# Patient Record
Sex: Male | Born: 1935 | Race: White | Hispanic: No | Marital: Single
Health system: Southern US, Community
[De-identification: ages and names within clinical notes are randomized; demographics above are authoritative.]

---

## 1998-08-27 ENCOUNTER — Observation Stay (HOSPITAL_COMMUNITY): Admission: AD | Admit: 1998-08-27 | Discharge: 1998-08-28 | Payer: Self-pay | Admitting: Cardiology

## 1998-08-29 ENCOUNTER — Encounter: Payer: Self-pay | Admitting: Cardiology

## 1998-08-29 ENCOUNTER — Ambulatory Visit (HOSPITAL_COMMUNITY): Admission: RE | Admit: 1998-08-29 | Discharge: 1998-08-29 | Payer: Self-pay | Admitting: Cardiology

## 1998-09-23 ENCOUNTER — Inpatient Hospital Stay (HOSPITAL_COMMUNITY): Admission: RE | Admit: 1998-09-23 | Discharge: 1998-09-24 | Payer: Self-pay | Admitting: *Deleted

## 1999-04-11 ENCOUNTER — Encounter: Payer: Self-pay | Admitting: *Deleted

## 1999-04-14 ENCOUNTER — Inpatient Hospital Stay (HOSPITAL_COMMUNITY): Admission: RE | Admit: 1999-04-14 | Discharge: 1999-04-15 | Payer: Self-pay | Admitting: *Deleted

## 2004-11-28 ENCOUNTER — Ambulatory Visit: Payer: Self-pay | Admitting: Internal Medicine

## 2007-06-06 ENCOUNTER — Ambulatory Visit: Payer: Self-pay | Admitting: Anesthesiology

## 2007-06-10 ENCOUNTER — Ambulatory Visit: Payer: Self-pay | Admitting: Anesthesiology

## 2007-06-30 ENCOUNTER — Ambulatory Visit: Payer: Self-pay | Admitting: Anesthesiology

## 2008-01-09 ENCOUNTER — Ambulatory Visit: Payer: Self-pay | Admitting: Anesthesiology

## 2008-01-31 ENCOUNTER — Ambulatory Visit: Payer: Self-pay | Admitting: Anesthesiology

## 2008-03-06 ENCOUNTER — Ambulatory Visit: Payer: Self-pay | Admitting: Anesthesiology

## 2008-03-15 ENCOUNTER — Encounter: Payer: Self-pay | Admitting: Anesthesiology

## 2008-04-01 ENCOUNTER — Encounter: Payer: Self-pay | Admitting: Anesthesiology

## 2008-05-23 ENCOUNTER — Ambulatory Visit: Payer: Self-pay | Admitting: Anesthesiology

## 2008-06-21 ENCOUNTER — Ambulatory Visit: Payer: Self-pay | Admitting: Anesthesiology

## 2008-08-09 ENCOUNTER — Ambulatory Visit: Payer: Self-pay | Admitting: Anesthesiology

## 2008-11-08 ENCOUNTER — Ambulatory Visit: Payer: Self-pay | Admitting: Anesthesiology

## 2009-02-14 ENCOUNTER — Ambulatory Visit: Payer: Self-pay | Admitting: Anesthesiology

## 2009-05-14 ENCOUNTER — Ambulatory Visit: Payer: Self-pay | Admitting: Anesthesiology

## 2009-07-16 ENCOUNTER — Ambulatory Visit: Payer: Self-pay | Admitting: Vascular Surgery

## 2009-08-06 ENCOUNTER — Ambulatory Visit: Payer: Self-pay | Admitting: Vascular Surgery

## 2012-08-30 ENCOUNTER — Ambulatory Visit: Payer: Self-pay | Admitting: Oncology

## 2012-09-18 LAB — URINALYSIS, COMPLETE
Bilirubin,UR: NEGATIVE
Ketone: NEGATIVE
Nitrite: NEGATIVE
RBC,UR: 19 /HPF (ref 0–5)
Specific Gravity: 1.016 (ref 1.003–1.030)
WBC UR: 638 /HPF (ref 0–5)

## 2012-09-19 ENCOUNTER — Inpatient Hospital Stay: Payer: Self-pay | Admitting: Internal Medicine

## 2012-09-19 LAB — CBC
HCT: 34.6 % — ABNORMAL LOW (ref 40.0–52.0)
HGB: 11 g/dL — ABNORMAL LOW (ref 13.0–18.0)
MCH: 28.7 pg (ref 26.0–34.0)
MCV: 90 fL (ref 80–100)
Platelet: 230 10*3/uL (ref 150–440)
RBC: 3.83 10*6/uL — ABNORMAL LOW (ref 4.40–5.90)
RDW: 14.3 % (ref 11.5–14.5)

## 2012-09-19 LAB — BASIC METABOLIC PANEL
Anion Gap: 6 — ABNORMAL LOW (ref 7–16)
BUN: 15 mg/dL (ref 7–18)
Co2: 28 mmol/L (ref 21–32)
Creatinine: 0.95 mg/dL (ref 0.60–1.30)
EGFR (African American): >60
EGFR (Non-African Amer.): >60
Glucose: 147 mg/dL — ABNORMAL HIGH (ref 65–99)
Osmolality: 279 (ref 275–301)
Potassium: 3.9 mmol/L (ref 3.5–5.1)

## 2012-09-19 LAB — MAGNESIUM
Magnesium: 1.7 mg/dL — ABNORMAL LOW
Magnesium: 1.9 mg/dL

## 2012-09-19 LAB — COMPREHENSIVE METABOLIC PANEL
Albumin: 2.9 g/dL — ABNORMAL LOW (ref 3.4–5.0)
Bilirubin,Total: 0.3 mg/dL (ref 0.2–1.0)
Calcium, Total: 7.9 mg/dL — ABNORMAL LOW (ref 8.5–10.1)
Chloride: 101 mmol/L (ref 98–107)
Co2: 29 mmol/L (ref 21–32)
Creatinine: 0.96 mg/dL (ref 0.60–1.30)
EGFR (African American): >60
Glucose: 120 mg/dL — ABNORMAL HIGH (ref 65–99)
Potassium: 3.8 mmol/L (ref 3.5–5.1)
SGPT (ALT): 17 U/L (ref 12–78)

## 2012-09-19 LAB — CK TOTAL AND CKMB (NOT AT ARMC): CK, Total: 51 U/L (ref 35–232)

## 2012-09-19 LAB — TROPONIN I: Troponin-I: <0.02 ng/mL

## 2012-09-19 LAB — HEMOGLOBIN: HGB: 8.2 g/dL — ABNORMAL LOW (ref 13.0–18.0)

## 2012-09-19 LAB — PROTIME-INR: Prothrombin Time: 14.2 secs (ref 11.5–14.7)

## 2012-09-20 LAB — HEMOGLOBIN
HGB: 7.7 g/dL — ABNORMAL LOW (ref 13.0–18.0)
HGB: 7.2 g/dL — ABNORMAL LOW (ref 13.0–18.0)
HGB: 7.2 g/dL — ABNORMAL LOW (ref 13.0–18.0)

## 2012-09-21 LAB — HEMOGLOBIN
HGB: 8 g/dL — ABNORMAL LOW (ref 13.0–18.0)
HGB: 8 g/dL — ABNORMAL LOW (ref 13.0–18.0)

## 2012-09-21 LAB — URINE CULTURE

## 2012-09-23 LAB — PATHOLOGY REPORT

## 2012-09-24 LAB — HEMOGLOBIN
HGB: 8 g/dL — ABNORMAL LOW (ref 13.0–18.0)
HGB: 10.2 g/dL — ABNORMAL LOW (ref 13.0–18.0)

## 2012-09-24 LAB — CEA: CEA: 25.9 ng/mL — ABNORMAL HIGH (ref 0.0–4.7)

## 2012-09-25 LAB — CBC WITH DIFFERENTIAL/PLATELET
Eosinophil #: 0.8 10*3/uL — ABNORMAL HIGH (ref 0.0–0.7)
Eosinophil %: 6.7 %
HCT: 29.3 % — ABNORMAL LOW (ref 40.0–52.0)
HGB: 10 g/dL — ABNORMAL LOW (ref 13.0–18.0)
Lymphocyte #: 2.4 10*3/uL (ref 1.0–3.6)
Lymphocyte %: 18.8 %
MCHC: 34.2 g/dL (ref 32.0–36.0)
Monocyte #: 1.8 x10 3/mm — ABNORMAL HIGH (ref 0.2–1.0)
Monocyte %: 14.3 %
Neutrophil %: 60.1 %
RBC: 3.43 10*6/uL — ABNORMAL LOW (ref 4.40–5.90)
RDW: 15.1 % — ABNORMAL HIGH (ref 11.5–14.5)
WBC: 12.5 10*3/uL — ABNORMAL HIGH (ref 3.8–10.6)

## 2012-09-25 LAB — BASIC METABOLIC PANEL
BUN: 9 mg/dL (ref 7–18)
Calcium, Total: 7.9 mg/dL — ABNORMAL LOW (ref 8.5–10.1)
Co2: 31 mmol/L (ref 21–32)
Creatinine: 1.01 mg/dL (ref 0.60–1.30)
EGFR (African American): >60
EGFR (Non-African Amer.): >60
Osmolality: 278 (ref 275–301)
Sodium: 138 mmol/L (ref 136–145)

## 2012-09-26 LAB — CBC WITH DIFFERENTIAL/PLATELET
Basophil %: 0.5 %
Eosinophil %: 3.1 %
HCT: 30.9 % — ABNORMAL LOW (ref 40.0–52.0)
HGB: 10.5 g/dL — ABNORMAL LOW (ref 13.0–18.0)
Lymphocyte #: 1.7 10*3/uL (ref 1.0–3.6)
MCHC: 34 g/dL (ref 32.0–36.0)
MCV: 86 fL (ref 80–100)
Monocyte #: 1.6 x10 3/mm — ABNORMAL HIGH (ref 0.2–1.0)
Monocyte %: 13.5 %
Neutrophil #: 8.4 10*3/uL — ABNORMAL HIGH (ref 1.4–6.5)
Neutrophil %: 69 %
Platelet: 194 10*3/uL (ref 150–440)
WBC: 12.2 10*3/uL — ABNORMAL HIGH (ref 3.8–10.6)

## 2012-09-26 LAB — BASIC METABOLIC PANEL
BUN: 11 mg/dL (ref 7–18)
Calcium, Total: 8.2 mg/dL — ABNORMAL LOW (ref 8.5–10.1)
Creatinine: 1.15 mg/dL (ref 0.60–1.30)
EGFR (African American): >60
EGFR (Non-African Amer.): 58 — ABNORMAL LOW
Glucose: 158 mg/dL — ABNORMAL HIGH (ref 65–99)
Potassium: 4.1 mmol/L (ref 3.5–5.1)
Sodium: 135 mmol/L — ABNORMAL LOW (ref 136–145)

## 2012-09-27 LAB — CBC WITH DIFFERENTIAL/PLATELET
HGB: 9.6 g/dL — ABNORMAL LOW (ref 13.0–18.0)
Lymphocytes: 12 %
MCH: 29.6 pg (ref 26.0–34.0)
Monocytes: 8 %
RBC: 3.24 10*6/uL — ABNORMAL LOW (ref 4.40–5.90)
RDW: 15 % — ABNORMAL HIGH (ref 11.5–14.5)
Segmented Neutrophils: 79 %
WBC: 15.8 10*3/uL — ABNORMAL HIGH (ref 3.8–10.6)

## 2012-09-27 LAB — BASIC METABOLIC PANEL
Anion Gap: 4 — ABNORMAL LOW (ref 7–16)
BUN: 17 mg/dL (ref 7–18)
Calcium, Total: 8 mg/dL — ABNORMAL LOW (ref 8.5–10.1)
Chloride: 96 mmol/L — ABNORMAL LOW (ref 98–107)
Co2: 34 mmol/L — ABNORMAL HIGH (ref 21–32)
Osmolality: 273 (ref 275–301)

## 2012-09-28 LAB — CBC WITH DIFFERENTIAL/PLATELET
Eosinophil: 1 %
HCT: 30.2 % — ABNORMAL LOW (ref 40.0–52.0)
Lymphocytes: 16 %
MCV: 87 fL (ref 80–100)
Monocytes: 13 %
Myelocyte: 1 %
RBC: 3.45 10*6/uL — ABNORMAL LOW (ref 4.40–5.90)
WBC: 16.1 10*3/uL — ABNORMAL HIGH (ref 3.8–10.6)

## 2012-09-28 LAB — BASIC METABOLIC PANEL
BUN: 20 mg/dL — ABNORMAL HIGH (ref 7–18)
Glucose: 129 mg/dL — ABNORMAL HIGH (ref 65–99)
Sodium: 133 mmol/L — ABNORMAL LOW (ref 136–145)

## 2012-09-29 ENCOUNTER — Ambulatory Visit: Payer: Self-pay | Admitting: Oncology

## 2013-03-10 ENCOUNTER — Emergency Department: Payer: Self-pay | Admitting: Emergency Medicine

## 2013-03-10 LAB — COMPREHENSIVE METABOLIC PANEL
Albumin: 2.7 g/dL — ABNORMAL LOW (ref 3.4–5.0)
Alkaline Phosphatase: 82 U/L (ref 50–136)
Anion Gap: 6 — ABNORMAL LOW (ref 7–16)
BUN: 14 mg/dL (ref 7–18)
Bilirubin,Total: 0.4 mg/dL (ref 0.2–1.0)
Calcium, Total: 9 mg/dL (ref 8.5–10.1)
Chloride: 97 mmol/L — ABNORMAL LOW (ref 98–107)
Co2: 30 mmol/L (ref 21–32)
Creatinine: 0.98 mg/dL (ref 0.60–1.30)
EGFR (African American): >60
EGFR (Non-African Amer.): >60
Glucose: 244 mg/dL — ABNORMAL HIGH (ref 65–99)
Osmolality: 275 (ref 275–301)
Potassium: 3.9 mmol/L (ref 3.5–5.1)
SGOT(AST): 20 U/L (ref 15–37)
SGPT (ALT): 12 U/L (ref 12–78)
Sodium: 133 mmol/L — ABNORMAL LOW (ref 136–145)
Total Protein: 6.2 g/dL — ABNORMAL LOW (ref 6.4–8.2)

## 2013-03-10 LAB — CBC WITH DIFFERENTIAL/PLATELET
Basophil #: 0.1 10*3/uL (ref 0.0–0.1)
Basophil %: 0.8 %
Eosinophil #: 1.4 10*3/uL — ABNORMAL HIGH (ref 0.0–0.7)
Eosinophil %: 8.6 %
HCT: 37.9 % — ABNORMAL LOW (ref 40.0–52.0)
HGB: 12.1 g/dL — ABNORMAL LOW (ref 13.0–18.0)
Lymphocyte #: 6.2 10*3/uL — ABNORMAL HIGH (ref 1.0–3.6)
Lymphocyte %: 37.1 %
MCH: 29.4 pg (ref 26.0–34.0)
MCHC: 31.9 g/dL — ABNORMAL LOW (ref 32.0–36.0)
MCV: 92 fL (ref 80–100)
Monocyte #: 1.3 x10 3/mm — ABNORMAL HIGH (ref 0.2–1.0)
Monocyte %: 8 %
Neutrophil #: 7.6 10*3/uL — ABNORMAL HIGH (ref 1.4–6.5)
Neutrophil %: 45.5 %
Platelet: 261 10*3/uL (ref 150–440)
RBC: 4.11 10*6/uL — ABNORMAL LOW (ref 4.40–5.90)
RDW: 14 % (ref 11.5–14.5)
WBC: 16.6 10*3/uL — ABNORMAL HIGH (ref 3.8–10.6)

## 2013-03-10 LAB — PHOSPHORUS: Phosphorus: 4.8 mg/dL (ref 2.5–4.9)

## 2013-03-10 LAB — PROTIME-INR
INR: 0.9
Prothrombin Time: 12.8 secs (ref 11.5–14.7)

## 2013-03-10 LAB — TROPONIN I: Troponin-I: 0.62 ng/mL — ABNORMAL HIGH

## 2013-03-10 LAB — MAGNESIUM: Magnesium: 1.5 mg/dL — ABNORMAL LOW

## 2013-04-01 DEATH — deceased

## 2014-05-10 IMAGING — CT NM PET TUM IMG INITIAL (PI) SKULL BASE T - THIGH
5 series · 24 of 25 positions shown · non-contrast
Comparison: none

REASON FOR EXAM: rectal cancer restaging
COMMENTS:

PROCEDURE:     PET - PET/CT INIT STAGING COLORECTAL  - September 23, 2012 [DATE]
RESULT:     Indication: Rectal cancer
Radiopharmaceutical: 12.48 mCi F18-FDG, intravenously.
TECHNIQUE: Imaging was performed from the skull base to the mid-thigh using
routine PET/CT acquisition protocol.
Injection site: Left wrist
Time of FDG injection: 7874 hours
Serum glucose: 155 mg/dL
Time of imaging: 6646 hours
Comparison studies: NONE

[Series 3: ct wb 3.0 b30f · axial · 3.0mm · 0.98mm/px · z∈[-1332,-464]mm · 9 of 435 slices shown]
[im 1/435]
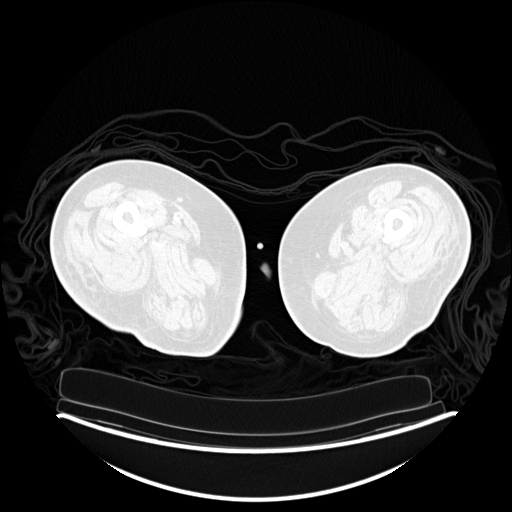
[im 55/435]
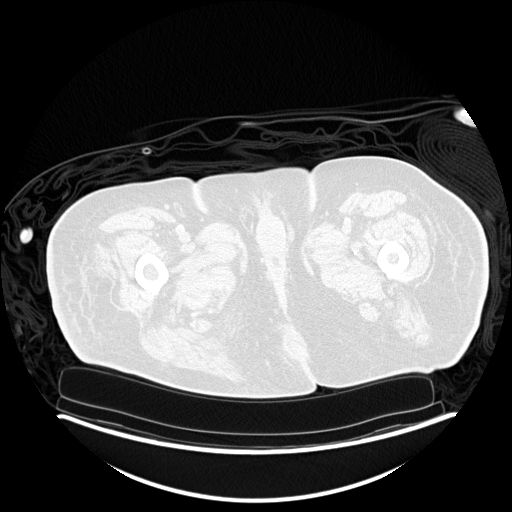
[im 109/435]
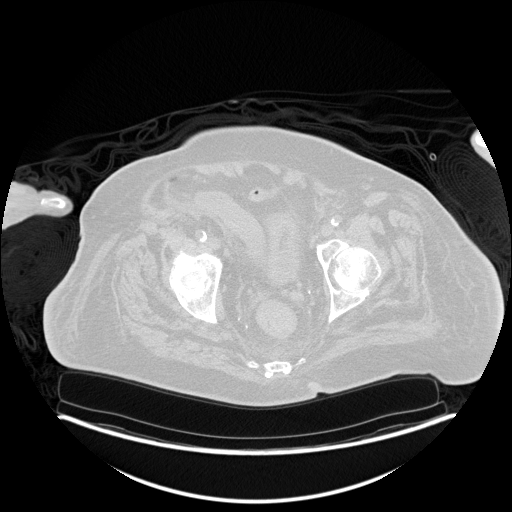
[im 163/435]
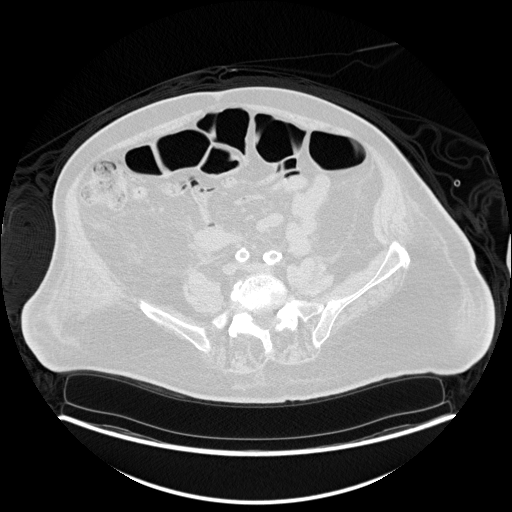
[im 218/435]
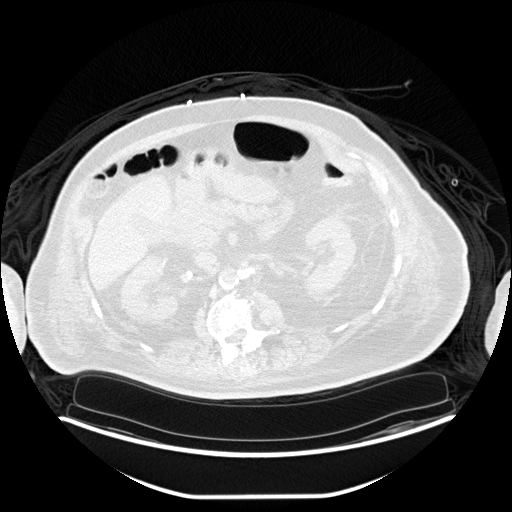
[im 272/435]
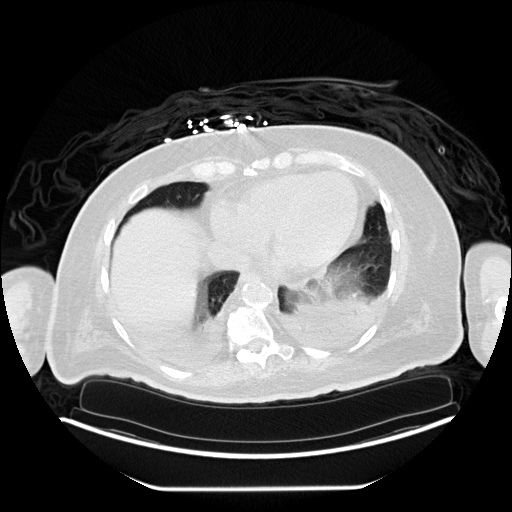
[im 326/435]
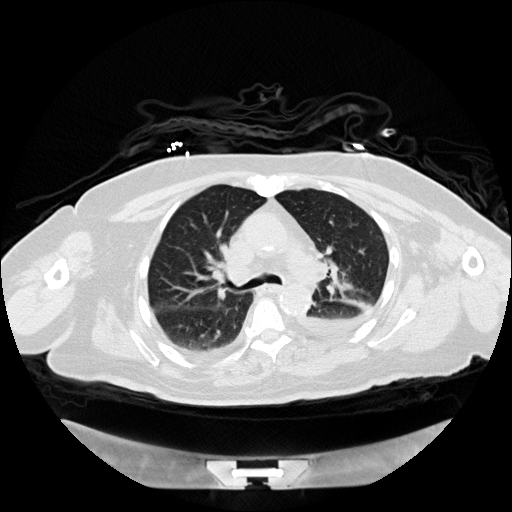
[im 380/435]
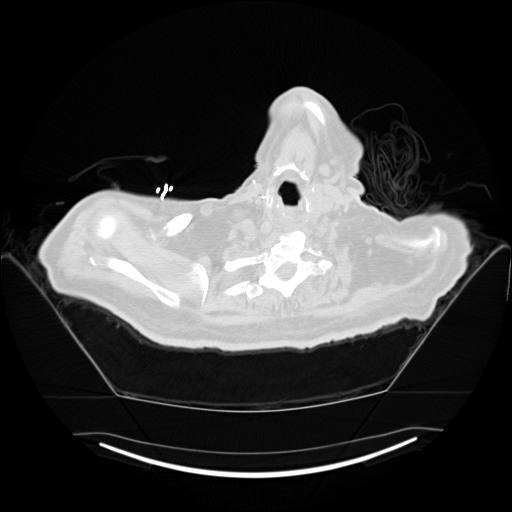
[im 435/435]
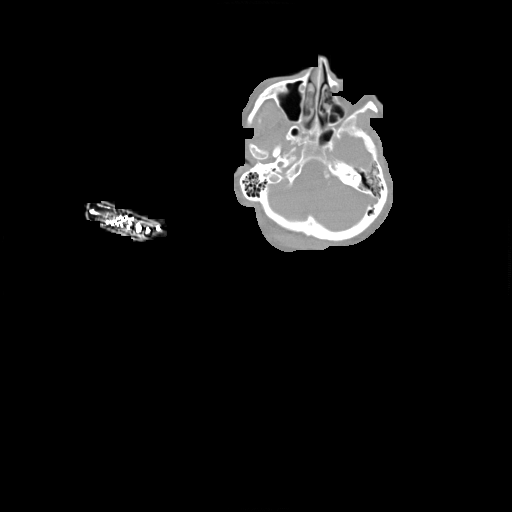

[Series 102: pet wb · axial · 5.0mm · 4.07mm/px · z∈[-1330,-464]mm · 6 of 290 slices shown]
[im 1/290]
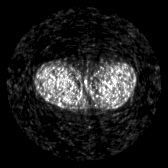
[im 49/290]
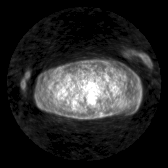
[im 97/290]
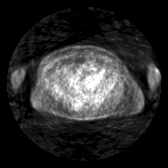
[im 193/290]
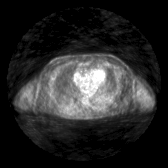
[im 241/290]
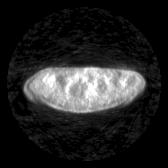
[im 290/290]
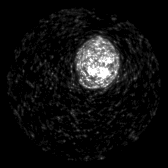

[Series 803: pet axial · 4 of 168 slices shown]
[im 1/168]
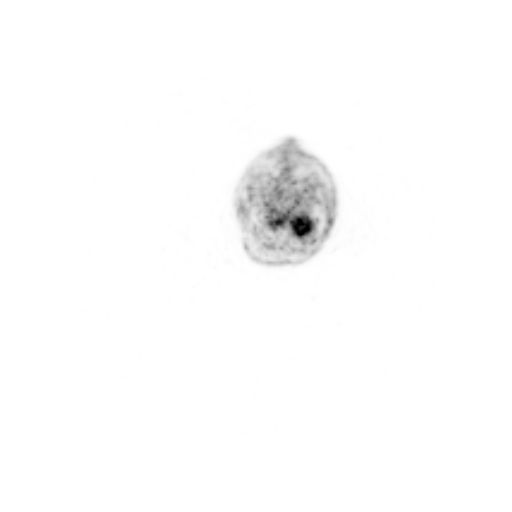
[im 56/168]
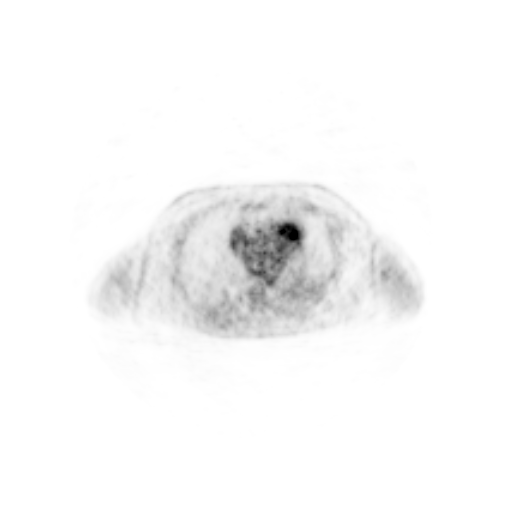
[im 112/168]
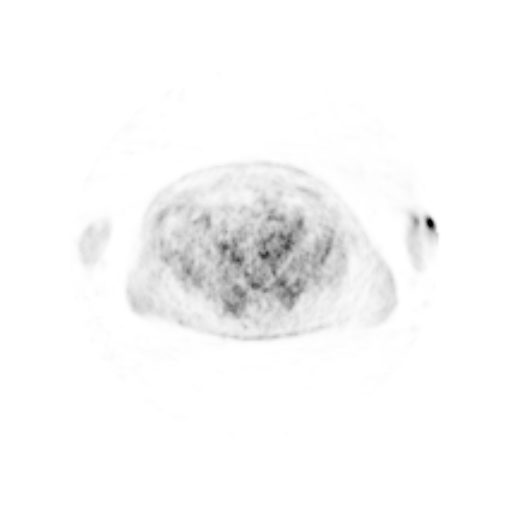
[im 168/168]
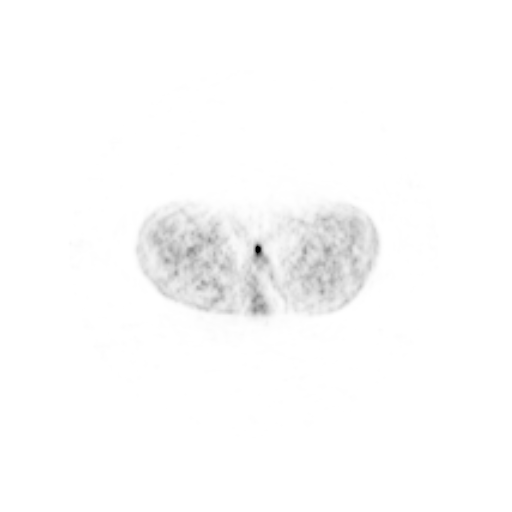

[Series 804: pet coronal · 2 of 65 slices shown]
[im 1/65]
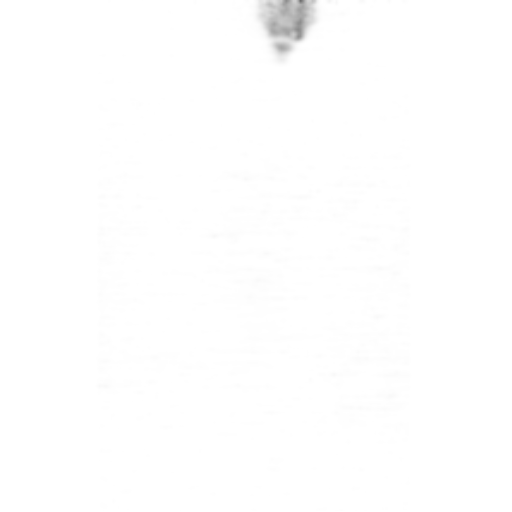
[im 65/65]
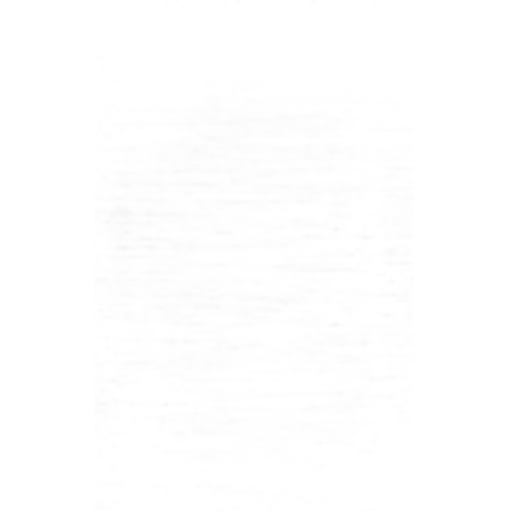

[Series 805: pet sagittal · 3 of 125 slices shown]
[im 1/125]
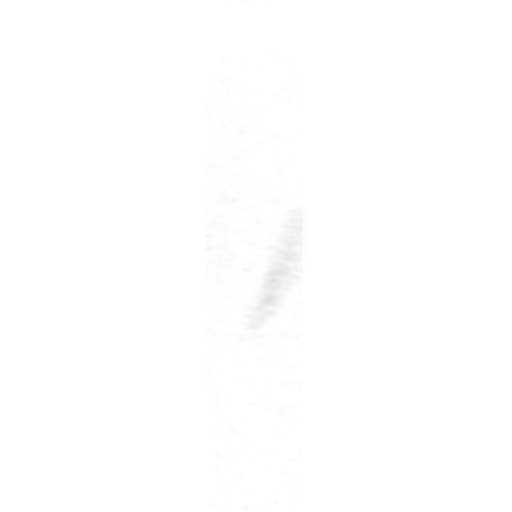
[im 63/125]
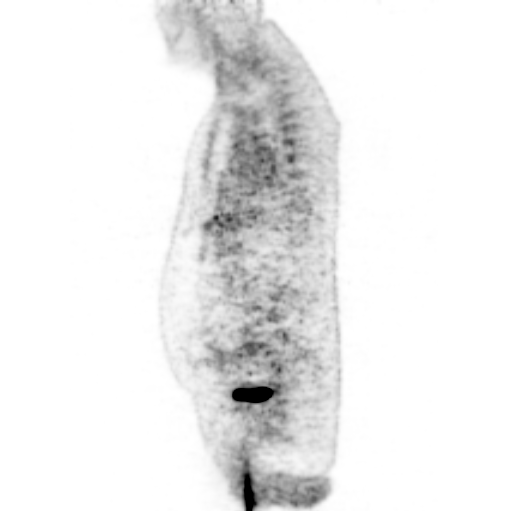
[im 125/125]
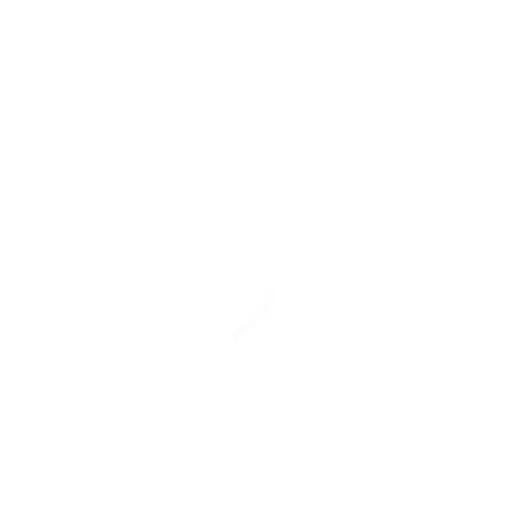

[24 of 25 positions shown; findings below may reference images not displayed]

FINDINGS: HEAD AND NECK:

There is a cervical muscular activity likely physiologic. There are no other
abnormal hypermetabolic activity in the head and neck. There is no cervical
soft tissue mass or lymphandenopathy.

CHEST:

There is no abnormal hypermetabolic activity in the chest.

There are bilateral small pleural effusions.

The heart size is normal. There is no pericardial effusion. There is
multivessel coronary artery atherosclerosis.

There no pathologically enlarged mediastinal, hilar, or axillary lymph
nodes.

The osseous structures demonstrate no focal abnormality.

ABDOMEN/PELVIS:

The liver demonstrates no focal abnormality. The gallbladder is
unremarkable. The spleen demonstrates no focal abnormality. The kidneys,
adrenal glands, pancreas are normal. The bladder is unremarkable.

There is abnormal hypermetabolic activity within the which may be
physiologic versus secondary to a rectal mass. Correlation with direct
visualization is recommended. The area measures an SUV max of 4.6 and an SUV
average of 2.9 . There is no pneumoperitoneum, pneumatosis, or portal venous
gas. There is no abdominal or pelvic free fluid. There is no
lymphadenopathy.

The abdominal aorta is normal in caliber.

There is lumbar spine spondylosis.
IMPRESSION: 1. There is abnormal hypermetabolic activity within the which may be
physiologic versus secondary to a rectal mass. Correlation with direct
visualization is recommended.

2. Coronary artery disease.

3. Bilateral small pleural effusions.

[REDACTED]

## 2014-09-21 NOTE — Consult Note (Signed)
CC LGI bleeding.  Pt had further bleeding with mucus also, Due to never had a colonoscopy and the bleeding won't stop on its own I need to do a colonoscopy today, possible sources include diverticulosis, colon neoplasm, AVM of colon, etc.  Patient in agreement.  Will prep this morning.  Scope this afternoon.  Electronic Signatures: Scot JunElliott, Magalene Mclear T (MD)  (Signed on 23-Apr-14 07:28)  Authored  Last Updated: 23-Apr-14 07:28 by Scot JunElliott, Bronte Kropf T (MD)

## 2014-09-21 NOTE — Consult Note (Signed)
Brief Consult Note: Diagnosis: GI bleed.   Comments: At this point embolization is not the primary therapy rather establishing adequate IV access then resucitating and transfusing the patient should be the initial goal.  The bleeding scan althugh helpful prior to embolization is secondary to stablization.  Electronic Signatures: Levora DredgeSchnier, Gregory (MD)  (Signed 20-Apr-14 23:02)  Authored: Brief Consult Note   Last Updated: 20-Apr-14 23:02 by Levora DredgeSchnier, Gregory (MD)

## 2014-09-21 NOTE — Consult Note (Signed)
CC: rectal bleeding.  Pt with colonoscopy showing anorectal cancer very likely.  BX done.  Discussed with Dr. Dan HumphreysWalker and his partner Vickki HearingHarry White.  Will likely call oncologist.  Electronic Signatures: Scot JunElliott, [place] Yeats T (MD)  (Signed on 23-Apr-14 15:41)  Authored  Last Updated: 23-Apr-14 15:41 by Scot JunElliott, Destin Vinsant T (MD)

## 2014-09-21 NOTE — Consult Note (Signed)
PATIENT NAME:  Curtis Fisher, Curtis Fisher DATE OF BIRTH:  03/03/1926  DATE OF CONSULTATION:  09/19/2012  CONSULTING PHYSICIAN:  Scot Junobert T. Elliott, MD  HISTORY OF PRESENT ILLNESS:   The patient is an 81 year old white male well known to me. He had the onset of rectal bleeding with a large amount of blood and clots passing per rectum. Came to the ER. His hemoglobin was recorded as 7. A nuclear GI bleeding scan done in the ER was negative; that was at 2:00 a.m. this morning. He was admitted to the CCU, and I was asked to see him in consultation.   He has received 2 units of blood, and his last hemoglobin was reported as 8.2 this morning at 11:44.   REVIEW OF SYSTEMS:  He denies any chest pains. No shortness of breath. He says he has never had a heart attack before, and he has never had a stroke. He denies any asthma or wheezing. He has not had any abdominal pain. No hematemesis. He has not had prior GI bleeding. No dysuria or hematuria.   PAST MEDICAL HISTORY:  Significant for hypertension, coronary artery disease, previous stent implant, peripheral vascular disease atherosclerosis of the aorta and lower extremities status post stent implant, left external iliac artery; angioplasty of the left superficial femoral artery, status post carotid endarterectomy, diabetes type 2 and history of tobacco abuse.   HABITS:  Smokes 2 packs a day. Continues to smoke.   SOCIAL HISTORY:  Lives with partner. 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    MEDICATIONS ON ADMISSION:  Acetaminophen with hydrocodone 325/10 p.r.n., aspirin 81 mg a day, atenolol 25 mg a day, atorvastatin 10 mg a day, diazepam 5 mg b.i.d. as needed, enalapril 10 mg twice a day, furosemide 20 mg a day, hydroxyzine 25 mg 2 tablets q.6 hours p.r.n., Lantus insulin 5 to 10 units at night, metformin 1000 mg twice a day, tamsulosin 0.4 mg once a day, Xalatan eyedrops, Ambien 5 mg at bedtime.   ALLERGIES:  PENICILLIN.   PHYSICAL  EXAMINATION: GENERAL:  Elderly white male, looks somewhat pale. Sleeping, but easily arousable. Oriented to place and person.  HEENT:  Sclerae are anicteric. Conjunctivae are somewhat pale. Tongue is somewhat pale.  CHEST:  Clear in anterolateral fields.  HEART:  No murmurs that I could hear.  ABDOMEN:  Old appendectomy scar. Bowel sounds are present, somewhat diminished. No hepatosplenomegaly. No masses. No bruits.  EXTREMITIES:  Show 1 to 2+ edema.  VITAL SIGNS:  Pulse 84, respirations 18, blood pressure 98/43, pulse ox of 98%, 2 liters of oxygen.   LABORATORY DATA:  Antibody screen is negative. Pro time 14.2, INR 1.1. Urinalysis shows 3+ blood, 3+ leukocyte esterase with white cells and red cells. Again, GI bleeding scan was negative.     ASSESSMENT:  Given the fact that his BUN is normal, and he has mostly bright red blood and clots, most likely, he has a lower GI bleed, and most likely lower GI bleed site would be diverticulosis. He has never had a colonoscopy before. Cannot rule out arteriovenous malformations or colonic neoplasm or a Dieulafoy's ulcer. Since it appears that the bleeding has stopped, we will give him sips of water only and progress very slowly to clear liquids and to full liquids. He and his partner requested he be a NO CODE.   ____________________________ Scot Junobert T. Elliott, MD rte:dmm D: 09/19/2012 12:50:00 ET T: 09/19/2012 13:23:59 ET JOB#: [id number]  cc: Jonny RuizJohn B. Danne HarborWalker III, MD  Duane Lope. Judithann Sheen, MD Scot Jun, MD, <Dictator>   Scot Jun MD ELECTRONICALLY SIGNED 10/12/2012 14:14

## 2014-09-21 NOTE — Consult Note (Signed)
Pt CC is GI bleedin, likely lower.  His bleeding scan was neg, no further bleeding reported.  with BUN normal this is likely a lower GI bleed and most likely source is diverticulosis.  He has never had a colonoscopy or upper endo before.  He and his partner request a NO Code status.  Will tell Dr. Dan HumphreysWalker.  Electronic Signatures: Scot JunElliott, Robert T (MD)  (Signed on 21-Apr-14 12:43)  Authored  Last Updated: 21-Apr-14 12:43 by Scot JunElliott, Robert T (MD)

## 2014-09-21 NOTE — Consult Note (Signed)
CC: rectal bleeding.  His bx showed adenocarcinoma as expected.  PET scan also lit up in expected area.    Electronic Signatures: Scot JunElliott, Robert T (MD)  (Signed on 25-Apr-14 16:54)  Authored  Last Updated: 25-Apr-14 16:54 by Scot JunElliott, Robert T (MD)

## 2014-09-21 NOTE — H&P (Signed)
PATIENT NAME:  Curtis Curtis Fisher, Ebrahim Fisher MR#:  [id number] DATE OF BIRTH:  29-Dec-1925  DATE OF ADMISSION:  09/19/2012  PRIMARY CARE PHYSICIAN: Dr. Aram BeechamJeffrey Sparks.   REFERRING PHYSICIAN: Dr. Enedina FinnerGoli.   CHIEF COMPLAINT: Rectal bleeding.   HISTORY OF PRESENT ILLNESS: The PATIENT is an 81 year old Caucasian male with a history of systemic hypertension, coronary disease and peripheral vascular disease. He was in his usual state of health until today when he started to pass bright red blood per rectum with a large amount of clots several times. The patient was brought to the hospital for further evaluation. He is hemodynamically stable. He is not tachycardic. Blood pressure is stable. However, his hemoglobin was low at 7. Also, there is evidence of electrolyte abnormalities with severe hypomagnesemia, and also, there is evidence of a urinary tract infection. A GI consultation was obtained in the Emergency Department, and Dr. Servando SnareWohl recommend to involve vascular surgery. Bleeding nuclear scan was ordered. The patient is now in the process to be admitted to the intensive care unit for further evaluation and treatment.   REVIEW OF SYSTEMS:  CONSTITUTIONAL: Denies any fever. No chills. No night sweats.  EYES: No blurring of vision. No double vision.  ENT: No hearing impairment. No sore throat. No dysphagia.  CARDIOVASCULAR: No chest pain. No shortness of breath. No syncope. No palpitations.  RESPIRATORY: No shortness of breath. No chest pain. No cough. No sputum production.  GASTROINTESTINAL: No abdominal pain and no vomiting, but he has bright red blood per rectum with clots of blood that started today.  GENITOURINARY: No dysuria or frequency of urination.  MUSCULOSKELETAL: No joint pain or swelling other than his chronic back pain. No muscular pain or swelling other than his edema of lower extremities and this is chronic.  INTEGUMENTARY: No skin rash. No ulcers.  NEUROLOGY: No focal weakness. No seizure activity. No  headache.  PSYCHIATRY: History of anxiety. No depression.  ENDOCRINE: No polyuria or polydipsia. No heat or cold intolerance.   PAST MEDICAL HISTORY: Systemic hypertension. Coronary artery disease, status post stent implant. Peripheral arterial disease. Atherosclerosis of the aorta and lower extremities, status post stent implant in left external iliac artery and angioplasty of left superficial femoral artery. History of carotid stenosis, status post carotid endarterectomy. Arthritis, back pain and scoliosis. Hypercholesterolemia, diabetes mellitus type 2 and tobacco abuse.   FAMILY HISTORY: His mother died of old age at the age of 79. His father died from a heart attack at the age of 79.   SURGICAL HISTORY: Stent implants for peripheral vascular disease of lower extremities in left external iliac artery and left superficial femoral artery and possibly in the right leg as well. History of cardiac stents. Appendectomy.   SOCIAL HABITS: He smokes more than 2 packs a day since the age of 89, and he continues to smoke. He is nonalcoholic. No history of drug abuse.   SOCIAL HISTORY: He lives with his partner. He never married. He retired from working at a funeral home.   ADMISSION MEDICATIONS: Acetaminophen with hydrocodone 325/10 1.8 hours p.r.n. Aspirin 81 mg a day. Atenolol 25 mg a day. Atorvastatin 10 mg a day. Diazepam 5 mg twice a day as needed. Enalapril 10 mg twice a day. Furosemide 20 mg once a day for leg edema. Hydroxyzine 25 mg, taking 2 tablets, that is 50 mg, q.6 hours p.r.n. Lantus 5 to 10 units at night. Metformin 500 mg 2 tablets, that is 1000 mg, twice a day. Tamsulosin or Flomax 0.4 mg  once a day. Xalatan 0.005% one drop in each eye at bedtime. Zolpidem or Ambien 5 mg at bedtime.   ALLERGY: PENICILLIN.   PHYSICAL EXAMINATION:  VITAL SIGNS: Blood pressure 177/73, respiratory rate 20, pulse 84, temperature 98, oxygen saturation 96%.  GENERAL APPEARANCE: Elderly male lying in bed in no  acute distress.  HEAD AND NECK: There is moderate pallor. No icterus. No cyanosis. Ear examination revealed normal hearing, no discharge, no lesions. Examination of the nose showed no discharge, no bleeding, no ulcers. Examination of the oropharyngeal area showed no oral thrush, no exudate, no ulcers. Eye examination revealed normal eyelids and conjunctivae. Both pupils are constricted. I could not see reactivity to light. Neck is supple. Trachea at midline. No masses.  HEART: Normal S1, S2. No S3, S4. No murmur. Distant heart sounds. No carotid bruits.  RESPIRATORY: Normal breathing pattern without the use of accessory muscles. No rales. No wheezing.  ABDOMEN: Soft without tenderness. No hepatosplenomegaly. No masses. No hernias.  MUSCULOSKELETAL: No joint swelling. No clubbing.  SKIN: No ulcers. No subcutaneous nodules.  NEUROLOGIC: Cranial nerves II through XII were intact. No focal motor deficit.  PSYCHIATRIC: The patient is alert and oriented x3. Mood and affect were normal.   LABORATORY FINDINGS: EKG showed normal sinus rhythm at rate of 74 per minute. Incomplete right bundle branch block. Poor progression of R waves in the anterior chest leads. Otherwise unremarkable EKG. Serum glucose 74, BUN 8, creatinine 0.4, sodium 149, potassium 123, bicarbonate 20. Anion gap was 6. Magnesium was 0.4. Albumin was low at 1.6. Liver transaminases were normal. Troponin less than 0.02. CBC showed white count of 11,000, hemoglobin 7.5, hematocrit 22, platelet count 152. MCV, MCH and MCHC were normal. Prothrombin time 16, INR 1.3, aPTT 35. Urinalysis showed cloudy urine, 638 white blood cells, +3 leukocyte esterase, +1 bacteria.   IMPRESSION:  1. Rectal bleeding with bright red per rectum and clots.  2. Severe anemia secondary to lower gastrointestinal bleed.  3. Hypernatremia.  4. Hypomagnesemia.  5. Hypoalbuminemia.  6. Urinary tract infection.  7. Coronary artery disease.  8. Peripheral arterial disease.   9. Diabetes mellitus, type 2.  10. Tobacco abuse.  11. Hypercholesterolemia.   PLAN: Will admit the patient to the intensive care unit. Keep n.p.o. except for the medications. Blood transfusion with 2 units of packed red blood cells was ordered. Nuclear bleeding scan was ordered. GI consultation and vascular surgery consultation. Regarding his medications, I will hold aspirin, enalapril, metformin and Lasix until the patient is stabilized. I will also hold Lantus since the blood sugar is only 74. The patient needs to quit smoking. Magnesium supplementation to correct the hypomagnesemia. IV hydration with normal saline and follow up on his electrolytes.   CODE STATUS: FULL CODE.   Time spent in evaluating this patient took more than 1 hour.   ____________________________ Carney Corners. Rudene Re, MD amd:gb D: 09/19/2012 00:11:34 ET T: 09/19/2012 04:54:11 ET JOB#: [id number]  cc: Carney Corners. Rudene Re, MD, <Dictator> Zollie Scale MD ELECTRONICALLY SIGNED 09/21/2012 22:58

## 2014-09-21 NOTE — Consult Note (Signed)
patient was seen and examined today.  Also discussed situation with Dr. Markham JordanElliot and Dr. Victorino DikeWalkerhad prolonged discussion with patient's family Detailed  consult note will follow after PET scan and the pathology reportrecommend transfusion to get hemoglobin up to 10 g and the intravenous iron followed by oral iron if tolerated rectal cancer final pathology report pendingscan tomorrow morning.It patient is discharged over the weekend his partner was given an appointment to see me and Dr. Rushie Chestnuthrystal  1:30on Monday  Electronic Signatures: Laddie Aquashoksi, Annisten Manchester K (MD)  (Signed on 24-Apr-14 12:14)  Authored  Last Updated: 24-Apr-14 12:14 by Laddie Aquashoksi, Jhordan Kinter K (MD)

## 2014-09-21 NOTE — Consult Note (Signed)
CC: anorectal cancer.  I will sign off, reconsult if I can be of service.  Electronic Signatures: Scot JunElliott, Robert T (MD)  (Signed on 24-Apr-14 16:10)  Authored  Last Updated: 24-Apr-14 16:10 by Scot JunElliott, Robert T (MD)

## 2014-09-21 NOTE — Consult Note (Signed)
Pt without complaints.  CC: lower GI bleed.  Hgb drifting to 7, no SOB at rest,  chest clear global decreased air flow, known COPD.  Check HGB in morning and if falls further transfuse a unit.  Electronic Signatures: Scot JunElliott, Robert T (MD)  (Signed on 22-Apr-14 17:27)  Authored  Last Updated: 22-Apr-14 17:27 by Scot JunElliott, Robert T (MD)

## 2014-09-21 NOTE — Consult Note (Signed)
History of Present Illness:  Reason for Consult 1Anorectal cancer final pathology is pending.  PET scan shows localized disease Patient presented with GI bleeding   HPI   HISTORY OF PRESENT ILLNESS: The PATIENT is an 81 year old Caucasian male with a history of systemic hypertension, coronary disease and peripheral vascular disease. He was in his usual state of health until today when he started to pass bright red blood per rectum with a large amount of clots several times. The patient was brought to the hospital for further evaluation. He is hemodynamically stable. He is not tachycardic. Blood pressure is stable. However, his hemoglobin was low at 7. Also, there is evidence of electrolyte abnormalities with severe hypomagnesemia, and also, there is evidence of a urinary tract infection. A GI consultation was obtained in the Emergency Department, patient underwent colonoscopy by Dr. Mechele Collin.  Fungating mass in the ano-rectal  area  was found.pathology is pending.  Patient had intermittent bleeding.patient had no arthritis low back pain previous history of fracture in lower extremity limiting his ambulation.has received multiple blood transfusions   PFSH:  Additional Past Medical and Surgical History PAST MEDICAL HISTORY: Systemic hypertension. Coronary artery disease, status post stent placement  Peripheral arterial disease. Atherosclerosis of the aorta and lower extremities, status post stent placement  in left external iliac artery and angioplasty of left superficial femoral artery. History of carotid stenosis, status post carotid endarterectomy. Arthritis, back pain and scoliosis. Hypercholesterolemia, diabetes mellitus type 2 and tobacco abuse.   FAMILY HISTORY: His mother died of old age at the age of 16. His father died from a heart attack at the age of 31.   SURGICAL HISTORY: Stent implants for peripheral vascular disease of lower extremities in left external iliac artery and left superficial  femoral artery and possibly in the right leg as well. History of cardiac stents. Appendectomy.   SOCIAL HABITS: He smokes more than 2 packs a day since the age of 63, and he continues to smoke. He is nonalcoholic. No history of drug abuse.   SOCIAL HISTORY: He lives with his partner. He never married. He retired from working at a funeral home.   Review of Systems:  General weakness  fatigue   Performance Status (ECOG) 2   HEENT no complaints   Lungs cough  SOB   Cardiac History of coronary artery disease   GI As described above   GU no complaints   Musculoskeletal back pain  muscle ache  joint pain   Extremities swelling  Intermittent swelling   Skin no complaints   Neuro no complaints   Endocrine no complaints   Psych anxiety   NURSING NOTES: **Vital Signs.:   25-Apr-14 12:45   Vital Signs Type: Pre-Blood   Temperature Temperature (F): 98.2   Celsius: 36.7   Temperature Source: axillary   Pulse Pulse: 92   Respirations Respirations: 20   Systolic BP Systolic BP: 145   Diastolic BP (mmHg) Diastolic BP (mmHg): 76   Mean BP: 99   Pulse Ox % Pulse Ox %: 97   Oxygen Delivery: 2L; Nasal Cannula   Physical Exam:  General Patient is alert oriented in bed   HEENT: normal   Lungs: crepitations  rhonchi  wheezing   Cardiac: Tachycardia.   Abdomen: soft  nontender  positive bowel sounds  Rectal examination was not done   Skin: intact   Extremities: No edema, rash or cyanosis   Neuro: AAOx3  cranial nerves intact   Psych: mood calm  depressed  Physical Exam LYMPHATICS:   No cervical, axillary, or inguinal lymphadenopathy     Diabetes:    Hypercholesterolemia:    Hypercholesterolemia:    Carotid Endarterectomy:    Appendectomy:    Stent - Cardiac: 2000   PCN: Swelling  Neosporin: Blisters    hydrOXYzine hydrochloride hydrochloride 25 mg oral tablet: 2 tabs (50mg ) orally every 6 hours as needed., Status: Active, Quantity: 0,  Refills: None   diazepam 5 mg oral tablet: 1 tab(s) orally 2 times a day as needed., Status: Active, Quantity: 0, Refills: None   zolpidem 5 mg oral tablet: 1 tab(s) orally once a day (at bedtime), Status: Active, Quantity: 0, Refills: None   tamsulosin 0.4 mg oral capsule: 1 cap(s) orally once a day after breakfast., Status: Active, Quantity: 0, Refills: None   furosemide 40 mg oral tablet: 0.5 tab (20mg ) orally once a day for edema., Status: Active, Quantity: 0, Refills: None   atorvastatin 10 mg oral tablet: 1 tab(s) orally once a day, Status: Active, Quantity: 0, Refills: None   enalapril 10 mg oral tablet: 1 tab(s) orally 2 times a day, Status: Active, Quantity: 0, Refills: None   metformin 500 mg oral tablet: 2 tabs (1000mg ) orally 2 times a day., Status: Active, Quantity: 0, Refills: None   aspirin 81 mg oral tablet: 1 tab(s) orally 3 to 4 times a week, Status: Active, Quantity: 0, Refills: None   atenolol 25 mg oral tablet: 1 tab(s) orally once a day, Status: Active, Quantity: 0, Refills: None   Lantus 100 units/mL subcutaneous solution: 5 to 10 unit(s) subcutaneous once a day (at bedtime)., Status: Active, Quantity: 0, Refills: None   acetaminophen-HYDROcodone 325 mg-10 mg oral tablet: 1 tab(s) orally 3 times a day as needed for pain., Status: Active, Quantity: 0, Refills: None   Xalatan 0.005% ophthalmic solution: 1 drop(s) into each eye once a day (at bedtime)., Status: Active, Quantity: 0, Refills: None  Radiology Results: XRay:    20-Apr-14 22:16, Chest Portable Single View  Chest Portable Single View   REASON FOR EXAM:    GI BLEED  COMMENTS:       PROCEDURE: DXR - DXR PORTABLE CHEST SINGLE VIEW  - Sep 18 2012 10:16PM     RESULT: There is prominent atherosclerotic calcification within the   aorta. The lungs are clear. The heart and pulmonary vesselsare normal.   The bony and mediastinal structures are unremarkable. There is no   effusion. There is no pneumothorax or  evidence of congestive failure.    IMPRESSION:  No acute cardiopulmonary disease. Prominent atherosclerotic   calcification.    Dictation Site: 6    Verified By: Elveria Royals, M.D., MD  LabUnknown:  PACS Image     21-Apr-14 02:02, GI Blood Loss Study - Nuc Med  PACS Image     25-Apr-14 12:03, PET/CT Scan Colorectal Cancer Initial Stage  PACS Image   Nuclear Med:    21-Apr-14 02:02, GI Blood Loss Study - Nuc Med  GI Blood Loss Study - Nuc Med   REASON FOR EXAM:    B  COMMENTS:       PROCEDURE: NM  - NM GI BLOOD LOSS STUDY  - Sep 19 2012  2:02AM     RESULT: The patient received an injection of 3.0 mL of PYP followed by   20.78 mCi of technetium 99 M pertechnetate. Anterior acquisition is   obtained for 60 minutes with lead shielding over the cardiac activity.   There is increasing  activity in the pelvic region the bladder of or time.   There is no abnormal accumulation of activity to suggest active   gastrointestinal hemorrhage.    IMPRESSION:   1. No findings to suggest active gastrointestinal hemorrhage during the   time observed.  Dictation Site: 1        Verified By: Elveria RoyalsGEOFFREY H. BROWNE, M.D., MD    25-Apr-14 12:03, PET/CT Scan Colorectal Cancer Initial Stage  PET/CT Scan Colorectal Cancer Initial Stage   REASON FOR EXAM:    rectal cancer restaging  COMMENTS:       PROCEDURE: PET - PET/CT INIT STAGING COLORECTAL  - Sep 23 2012 12:03PM     RESULT: Indication: Rectal cancer  Radiopharmaceutical: 12.48 mCi F18-FDG, intravenously.    Technique: Imaging was performed from the skull base to the mid-thigh   using routine PET/CT acquisition protocol.    Injection site: Left wrist  Time of FDG injection: 1014 hours  Serum glucose: 155 mg/dL   Time of imaging: 16101141 hours  Comparison studies: NONE    Findings:    HEAD AND NECK:     There is a cervical muscular activity likely physiologic. There are no   other abnormal hypermetabolic activity in the head and  neck. There is no   cervical soft tissue mass or lymphandenopathy.     CHEST:    There is no abnormal hypermetabolic activity in the chest.    There are bilateral small pleural effusions.  The heart size is normal. There is no pericardial effusion. There is   multivessel coronary artery atherosclerosis.    There no pathologically enlarged mediastinal, hilar, or axillary lymph   nodes.     The osseous structures demonstrate no focal abnormality.     ABDOMEN/PELVIS:    The liver demonstrates no focal abnormality. The gallbladder is   unremarkable. The spleen demonstrates no focal abnormality. The kidneys,   adrenal glands, pancreas are normal. The bladder is unremarkable.     There is abnormal hypermetabolic activity within the which may be     physiologic versus secondary to a rectal mass. Correlation with direct   visualization is recommended. The area measures an SUV max of 4.6 and an   SUV average of 2.9 . There is no pneumoperitoneum, pneumatosis, or portal   venous gas. There is no abdominal or pelvic free fluid. There is no   lymphadenopathy.     The abdominal aorta is normal in caliber.    There is lumbar spine spondylosis.    IMPRESSION:     1. There is abnormal hypermetabolic activity within the which may be   physiologic versus secondary to a rectal mass. Correlation with direct   visualization is recommended.   2. Coronary artery disease.    3. Bilateral small pleural effusions.    Dictation Site: 1        Verified By: Joellyn HaffHETAL P. PATEL, M.D., MD   Assessment and Plan: Impression:   1, Ano-rectal cancer pathology is pending Lower GI bleeding was likely bleeding from the tumor Anemia status post multiple transfusions and Coronary artery disease with overall low performance status due to multiple medical problem Plan:   I had prolonged discussion with patient and his partner Lollie SailsHarry.discussed possibility  getting final diagnosis and role for chemotherapy  radiation therapy patient tolerates the treatment.patient continues to bleed possibility of radiation therapy to begin withdiscussed situation with Dr. Ian MalkinGlen Chrystal , radiation oncologist  who is going to see patient .scan has been  reviewed  independently and shows  localized disease.iron supplement Transfuse him as needed to keep hemoglobin above 10 gBecause of underlying coronary artery disease.   CC Referral:  cc: Dr. Aram Beecham: Dr. Yates Decamp: Dr. Markham Jordan   Electronic Signatures: Doylene Canning, Gerome Sam (MD)  (Signed 25-Apr-14 14:13)  Authored: HISTORY OF PRESENT ILLNESS, PFSH, ROS, NURSING NOTES, PE, PAST MEDICAL HISTORY, ALLERGIES, HOME MEDICATIONS, OTHER RESULTS, ASSESSMENT AND PLAN, CC Referring Physician   Last Updated: 25-Apr-14 14:13 by Laddie Aquas (MD)

## 2014-09-21 NOTE — Discharge Summary (Signed)
PATIENT NAME:  Curtis Fisher, [name] MR#:  [id number] DATE OF BIRTH:  1925/09/03  DATE OF ADMISSION:  09/19/2012 DATE OF DISCHARGE:  09/29/2012  TYPE OF DISCHARGE: The patient is transferred to hospice home.   REASON FOR ADMISSION: Rectal bleeding.   HISTORY OF PRESENT ILLNESS: Please see the dictated HPI done by Dr. Rudene Rearwish on 09/19/2012.   PAST MEDICAL HISTORY:  1.  Benign hypertension.  2.  ASCVD.  3.  Peripheral vascular disease.  4.  Status post PTCA with stent placement.  5.  Venous stasis with peripheral edema.  6.  Status post iliac stent placements.  7.  Previous carotid endarterectomy.  8.  Chronic back pain.  9.  Osteoarthritis.  10. Hyperlipidemia.  11. Tobacco abuse.  12. Type 2 diabetes.   MEDICATIONS ON ADMISSION: Please see admission note.   ALLERGIES: PENICILLIN.   SOCIAL HISTORY, FAMILY HISTORY AND REVIEW OF SYSTEMS: As per admission note.   PHYSICAL EXAM:  GENERAL: The patient is elderly in no acute distress.  VITAL SIGNS: Stable and he was afebrile.  HEENT: Unremarkable.  NECK: Supple without JVD.  LUNGS: Clear.  CARDIAC: Regular rate and rhythm. Normal S1 and S2. ABDOMEN: Soft and nontender.  EXTREMITIES: Without edema.  NEUROLOGIC: Grossly nonfocal.   HOSPITAL COURSE: The patient was admitted with rectal bleeding. He was anemic as well. He was transfused 2 units of packed red blood cells. He was seen in consultation by GI. A nuclear bleeding scan was unremarkable. Colonoscopy revealed polyps with a mass. Biopsy returned positive for adenocarcinoma. The patient was seen in consultation by oncology. Radiation and chemotherapy were recommended. The patient was subsequently started on radiation therapy for rectal cancer. His bleeding stabilized as did his hemoglobin. However, after 2 radiation treatments, the patient and his partner decided to withhold therapy. He wanted to have comfort measures only and be moved to the hospice home. As a result, the patient's  radiation therapy treatments were stopped. Hospice was consulted. A bed at hospice home was found and he was transferred there for further terminal and comfort care.   DISCHARGE DIAGNOSES:  1.  Rectal cancer with bleeding.  2.  Anemia due to acute blood loss.  3.  Hypernatremia.  4.  Hypomagnesemia.  5.  Urinary tract infection.  6.  Atherosclerotic cardiovascular disease.  7.  Peripheral arterial disease.  8.  Type 2 diabetes.  9.  Hyperlipidemia.   FOLLOWUP PLANS AND APPOINTMENTS: The patient will be followed by the hospice physician at the hospice home. He will not undergo any chemo or radiation therapy for his rectal cancer. His prognosis is poor.   ____________________________ Duane LopeJeffrey D. Judithann SheenSparks, MD jds:aw D: 10/05/2012 09:21:22 ET T: 10/05/2012 09:34:47 ET JOB#: [id number]  cc: Duane LopeJeffrey D. Judithann SheenSparks, MD, <Dictator> Chinenye Katzenberger Rodena Medin Denica Web MD ELECTRONICALLY SIGNED 10/05/2012 11:35

## 2014-09-21 NOTE — Consult Note (Signed)
CHIEF COMPLAINT and HISTORY:  Subjective/Chief Complaint rectal bleeding   History of Present Illness 81 yo wm admitted last night with brisk lower GI bleeding.  No bleeding for about 8 hours now, and only one episode since admission.  His Bp is better around 110/30.  Not tachycardic.  Hgb 9.4.  Bleeding scan was negative for source localization  We are also asked to comment on his PVD.  Had stents placed 3 years ago by physician not in the community any longer and these have not been checked since. He is not amboulatory.  Has right leg swelling.  No ulceration or pain.   Past History PVD Dysphagia   PAST MEDICAL/SURGICAL HISTORY:  Past Medical History:   Diabetes:    Hypercholesterolemia:    Hypercholesterolemia:    Carotid Endarterectomy:    Appendectomy:    Stent - Cardiac:   ALLERGIES:  Allergies:  PCN: Swelling  Neosporin: Blisters  HOME MEDICATIONS:  Home Medications: Medication Instructions Status  hydrOXYzine hydrochloride hydrochloride 25 mg oral tablet 2 tabs (15m) orally every 6 hours as needed. Active  diazepam 5 mg oral tablet 1 tab(s) orally 2 times a day as needed. Active  zolpidem 5 mg oral tablet 1 tab(s) orally once a day (at bedtime) Active  tamsulosin 0.4 mg oral capsule 1 cap(s) orally once a day after breakfast. Active  furosemide 40 mg oral tablet 0.5 tab (260m orally once a day for edema. Active  atorvastatin 10 mg oral tablet 1 tab(s) orally once a day Active  enalapril 10 mg oral tablet 1 tab(s) orally 2 times a day Active  metformin 500 mg oral tablet 2 tabs (100070morally 2 times a day. Active  aspirin 81 mg oral tablet 1 tab(s) orally 3 to 4 times a week Active  atenolol 25 mg oral tablet 1 tab(s) orally once a day Active  Lantus 100 units/mL subcutaneous solution 5 to 10 unit(s) subcutaneous once a day (at bedtime). Active  acetaminophen-HYDROcodone 325 mg-10 mg oral tablet 1 tab(s) orally 3 times a day as needed for pain. Active  Xalatan  0.005% ophthalmic solution 1 drop(s) into each eye once a day (at bedtime). Active   Family and Social History:  Family History Coronary Artery Disease  Hypertension   Social History positive  tobacco, negative ETOH   + Tobacco Current (within 1 year)   Place of Living Home   Review of Systems:  Subjective/Chief Complaint BRBPR difficulty swallowing   Fever/Chills No   Cough No   Sputum No   Abdominal Pain No   Diarrhea Yes   Constipation No   Nausea/Vomiting No   SOB/DOE No   Chest Pain No   Telemetry Reviewed NSR   Dysuria No   Tolerating PT No   Tolerating Diet Yes   Medications/Allergies Reviewed Medications/Allergies reviewed   Physical Exam:  GEN well developed, well nourished   HEENT hearing intact to voice, moist oral mucosa   NECK No masses  trachea midline   RESP normal resp effort  no use of accessory muscles   CARD regular rate  LE edema present  moderate RLE edema, minimal LLE edema   ABD denies tenderness  no liver/spleen enlargement   GU foley catheter in place  clear yellow urine draining   LYMPH negative neck, negative axillae   EXTR negative cyanosis/clubbing, positive edema   SKIN normal to palpation, skin turgor good   NEURO motor/sensory function intact   PSYCH alert, A+O to time, place, person  LABS:  Laboratory Results: Hepatic:    20-Apr-14 22:08, Comprehensive Metabolic Panel  Bilirubin, Total -  Alkaline Phosphatase -  SGPT (ALT) -  SGOT (AST) -  Total Protein, Serum -  Albumin, Serum -    21-Apr-14 23:44, Comprehensive Metabolic Panel  Bilirubin, Total 0.3  Alkaline Phosphatase 80  SGPT (ALT) 17  SGOT (AST) 16  Total Protein, Serum 6.0  Albumin, Serum 2.9  Routine BB:    20-Apr-14 22:08, Type and Antibody Screen  ABO Group + Rh Type -  Antibody Screen -  Result(s) reported on 19 Sep 2012 at 12:08AM.    20-Apr-14 22:44, Crossmatch 4 Units  Crossmatch Unit 1 -  Crossmatch Unit 2 -  Crossmatch  Unit 3 -  Crossmatch Unit 4 -    20-Apr-14 23:44, Crossmatch 2 Units  Crossmatch Unit 1 Ready  Crossmatch Unit 2 Ready  Result(s) reported on 19 Sep 2012 at 12:58AM.    21-Apr-14 23:44, Type and Antibody Screen  ABO Group + Rh Type   A Positive  Antibody Screen NEGATIVE  Result(s) reported on 19 Sep 2012 at 12:54AM.  Routine Chem:    20-Apr-14 22:08, Activated PTT  Result Comment   LABS - SPECIMEN DRAWN ABOVE IV SITE, LEADING   - TO INACCURATE RESULTS. PATIENT REDRAWN   - AND RESTESTED. PMH NOTIFIED BRANDY   - KOZART TO DISREGARD LABORATORY VALUES.   - TESTS WOULD BE REORDERED...TPL   Result(s) reported on 19 Sep 2012 at 12:06AM.    20-Apr-14 22:08, Comprehensive Metabolic Panel  Glucose, Serum -  BUN -  Creatinine (comp) -  Sodium, Serum -  Potassium, Serum -  Chloride, Serum -  CO2, Serum -  Calcium (Total), Serum -  Osmolality (calc) -  eGFR (African American) -  eGFR (Non-African American) -  eGFR values <35m/min/1.73 m2 may be an indication of chronic  kidney disease (CKD).  Calculated eGFR is useful in patients with stable renal function.  The eGFR calculation will not be reliable in acutely ill patients  when serum creatinine is changing rapidly. It is not useful in   patients on dialysis. The eGFR calculation may not be applicable  to patients at the low and high extremes of body sizes, pregnant  women, and vegetarians.  Anion Gap -    20-Apr-14 22:08, Magnesium, Serum  Magnesium, Serum -  1.8-2.4  THERAPEUTIC RANGE: 4-7 mg/dL  TOXIC: > 10 mg/dL   -----------------------    20-Apr-14 22:44, Crossmatch 4 Units  Result Comment   CROSSMATCH - ONLY 2 NEEDED   Result(s) reported on 19 Sep 2012 at 12:50AM.    21-Apr-14 045:40 Basic Metabolic Panel (w/Total Calcium)  Glucose, Serum 147  BUN 15  Creatinine (comp) 0.95  Sodium, Serum 138  Potassium, Serum 3.9  Chloride, Serum 104  CO2, Serum 28  Calcium (Total), Serum 7.5  Anion Gap 6  Osmolality (calc)  279  eGFR (African American) >60  eGFR (Non-African American) >60  eGFR values <665mmin/1.73 m2 may be an indication of chronic  kidney disease (CKD).  Calculated eGFR is useful in patients with stable renal function.  The eGFR calculation will not be reliable in acutely ill patients  when serum creatinine is changing rapidly. It is not useful in   patients on dialysis. The eGFR calculation may not be applicable  to patients at the low and high extremes of body sizes, pregnant  women, and vegetarians.    21-Apr-14 03:39, Magnesium, Serum  Magnesium, Serum 1.7  1.8-2.4  THERAPEUTIC RANGE: 4-7 mg/dL  TOXIC: > 10 mg/dL   -----------------------    21-Apr-14 23:44, Comprehensive Metabolic Panel  Glucose, Serum 120  BUN 13  Creatinine (comp) 0.96  Sodium, Serum 136  Potassium, Serum 3.8  Chloride, Serum 101  CO2, Serum 29  Calcium (Total), Serum 7.9  Osmolality (calc) 273  eGFR (African American) >60  eGFR (Non-African American) >60  eGFR values <91m/min/1.73 m2 may be an indication of chronic  kidney disease (CKD).  Calculated eGFR is useful in patients with stable renal function.  The eGFR calculation will not be reliable in acutely ill patients  when serum creatinine is changing rapidly. It is not useful in   patients on dialysis. The eGFR calculation may not be applicable  to patients at the low and high extremes of body sizes, pregnant  women, and vegetarians.  Anion Gap 6    21-Apr-14 23:44, Magnesium, Serum  Magnesium, Serum 1.9  1.8-2.4  THERAPEUTIC RANGE: 4-7 mg/dL  TOXIC: > 10 mg/dL   -----------------------  Cardiac:    20-Apr-14 22:08, Cardiac Panel  CK, Total -  CPK-MB, Serum -  Result(s) reported on 19 Sep 2012 at 12:07AM.    20-Apr-14 22:08, Troponin I  Troponin I -  0.00-0.05  0.05 ng/mL or less: NEGATIVE   Repeat testing in 3-6 hrs   if clinically indicated.  >0.05 ng/mL: POTENTIAL   MYOCARDIAL INJURY. Repeat   testing in 3-6 hrs if   clinically  indicated.  NOTE: An increase or decrease   of 30% or more on serial   testing suggests a   clinically important change    21-Apr-14 23:44, Cardiac Panel  CK, Total 51  CPK-MB, Serum 1.4  Result(s) reported on 19 Sep 2012 at 12:29AM.    21-Apr-14 23:44, Troponin I  Troponin I < 0.02  0.00-0.05  0.05 ng/mL or less: NEGATIVE   Repeat testing in 3-6 hrs   if clinically indicated.  >0.05 ng/mL: POTENTIAL   MYOCARDIAL INJURY. Repeat   testing in 3-6 hrs if   clinically indicated.  NOTE: An increase or decrease   of 30% or more on serial   testing suggests a   clinically important change  Routine UA:    20-Apr-14 22:08, Urinalysis  Color (UA) Yellow  Clarity (UA) Cloudy  Glucose (UA) Negative  Bilirubin (UA) Negative  Ketones (UA) Negative  Specific Gravity (UA) 1.016  Blood (UA) 3+  pH (UA) 6.0  Protein (UA)   100 mg/dL  Nitrite (UA) Negative  Leukocyte Esterase (UA) 3+  Result(s) reported on 18 Sep 2012 at 11:05PM.  RBC (UA) 19 /HPF  WBC (UA) 638 /HPF  Bacteria (UA) 1+  Epithelial Cells (UA) <1 /HPF  WBC Clump (UA) PRESENT  Result(s) reported on 18 Sep 2012 at 11:05PM.  Routine Coag:    20-Apr-14 22:08, Activated PTT  Activated PTT (APTT) -  A HCT value >55% may artifactually increase the APTT. In one study,  the increase was an average of 19%.  Reference: "Effect on Routine and Special Coagulation Testing Values  of Citrate Anticoagulant Adjustment in Patients with High HCT Values."  American Journal of Clinical Pathology 2006;126:400-405.    20-Apr-14 22:08, Prothrombin Time  Prothrombin -  INR -  INR reference interval applies to patients on anticoagulant therapy.  A single INR therapeutic range for coumarins is not optimal for all  indications; however, the suggested range for most indications is  2.0 - 3.0.  Exceptions to the INR Reference Range may  include: Prosthetic heart  valves, acute myocardial infarction, prevention of myocardial  infarction, and  combinations of aspirin and anticoagulant. The need  for a higher or lower target INR must be assessed individually.  Reference: The Pharmacology and Management of the Vitamin K   antagonists: the seventh ACCP Conference on Antithrombotic and  Thrombolytic Therapy. [id number] Sept:126 (3suppl): [id number].  A HCT value >55% may artifactually increase the PT.  In one study,   the increase was an average of 25%.  Reference:  "Effect on Routine and Special Coagulation Testing Values  of Citrate Anticoagulant Adjustment in Patients with High HCT Values."  American Journal of Clinical Pathology 2006;126:400-405.    21-Apr-14 03:39, Activated PTT  Activated PTT (APTT) 31.2  A HCT value >55% may artifactually increase the APTT. In one study,  the increase was an average of 19%.  Reference: "Effect on Routine and Special Coagulation Testing Values  of Citrate Anticoagulant Adjustment in Patients with High HCT Values."  American Journal of Clinical Pathology 2006;126:400-405.    21-Apr-14 03:39, Prothrombin Time  Prothrombin 14.2  INR 1.1  INR reference interval applies to patients on anticoagulant therapy.  A single INR therapeutic range for coumarins is not optimal for all  indications; however, the suggested range for most indications is  2.0 - 3.0.  Exceptions to the INR Reference Range may include: Prosthetic heart  valves, acute myocardial infarction, prevention of myocardial  infarction, and combinations of aspirin and anticoagulant. The need  for a higher or lower target INR must be assessed individually.  Reference: The Pharmacology and Management of the Vitamin K   antagonists: the seventh ACCP Conference on Antithrombotic and  Thrombolytic Therapy. [id number] Sept:126 (3suppl): [id number].  A HCT value >55% may artifactually increase the PT.  In one study,   the increase was an average of 25%.  Reference:  "Effect on Routine and Special Coagulation Testing Values  of Citrate  Anticoagulant Adjustment in Patients with High HCT Values."  American Journal of Clinical Pathology 2006;126:400-405.    21-Apr-14 23:44, Prothrombin Time  Prothrombin 12.8  INR 0.9  INR reference interval applies to patients on anticoagulant therapy.  A single INR therapeutic range for coumarins is not optimal for all  indications; however, the suggested range for most indications is  2.0 - 3.0.  Exceptions to the INR Reference Range may include: Prosthetic heart  valves, acute myocardial infarction, prevention of myocardial  infarction, and combinations of aspirin and anticoagulant. The need  for a higher or lower target INR must be assessed individually.  Reference: The Pharmacology and Management of the Vitamin K   antagonists: the seventh ACCP Conference on Antithrombotic and  Thrombolytic Therapy. [id number] Sept:126 (3suppl): [id number].  A HCT value >55% may artifactually increase the PT.  In one study,   the increase was an average of 25%.  Reference:  "Effect on Routine and Special Coagulation Testing Values  of Citrate Anticoagulant Adjustment in Patients with High HCT Values."  American Journal of Clinical Pathology 2006;126:400-405.  Routine Hem:    20-Apr-14 22:08, Hemogram, Platelet Count  WBC (CBC) -  RBC (CBC) -  Hemoglobin (CBC) -  Hematocrit (CBC) -  Platelet Count (CBC) -  Result(s) reported on 19 Sep 2012 at 12:06AM.  MCV -  MCH -  MCHC -  RDW -    21-Apr-14 03:39, Hemoglobin  Hemoglobin (CBC) 9.4  Result(s) reported on 19 Sep 2012 at 03:55AM.    21-Apr-14 23:44, Hemogram, Platelet Count  WBC (CBC) 14.3  RBC (CBC) 3.83  Hemoglobin (CBC) 11.0  Hematocrit (CBC) 34.6  Platelet Count (CBC) 230  Result(s) reported on 19 Sep 2012 at 12:17AM.  MCV 90  MCH 28.7  MCHC 31.7  RDW 14.3   RADIOLOGY:  Radiology Results: XRay:    20-Apr-14 22:16, Chest Portable Single View  Chest Portable Single View  REASON FOR EXAM:    GI BLEED  COMMENTS:        PROCEDURE: DXR - DXR PORTABLE CHEST SINGLE VIEW  - Sep 18 2012 10:16PM     RESULT: There is prominent atherosclerotic calcification within the   aorta. The lungs are clear. The heart and pulmonary vesselsare normal.   The bony and mediastinal structures are unremarkable. There is no   effusion. There is no pneumothorax or evidence of congestive failure.    IMPRESSION:  No acute cardiopulmonary disease. Prominent atherosclerotic   calcification.    Dictation Site: 6    Verified By: Sundra Aland, M.D., MD  LabUnknown:  PACS Image    21-Apr-14 02:02, GI Blood Loss Study - Nuc Med  PACS Image  Nuclear Med:  GI Blood Loss Study - Nuc Med  REASON FOR EXAM:    B  COMMENTS:       PROCEDURE: NM  - NM GI BLOOD LOSS STUDY  - Sep 19 2012  2:02AM     RESULT: The patient received an injection of 3.0 mL of PYP followed by   20.78 mCi of technetium 99 M pertechnetate. Anterior acquisition is   obtained for 60 minutes with lead shielding over the cardiac activity.   There is increasing activity in the pelvic region the bladder of or time.   There is no abnormal accumulation of activity to suggest active   gastrointestinal hemorrhage.    IMPRESSION:   1. No findings to suggest active gastrointestinal hemorrhage during the   time observed.  Dictation Site: 1        Verified By: Sundra Aland, M.D., MD   ASSESSMENT AND PLAN:  Assessment/Admission Diagnosis Lower GI bleeding, seems to have stopped at this point. Bleeding scan negative.  No role for embolization at current.  If rebleeds would consider repeating bleeding scan to see if source can be identified and if so embolization could be considered PAD.  No evaluation since intervention 3 years ago.  No current symptoms of concern, but he is not ambulatory   Plan repeat bleeding scan if rebleed.  Otherwise, no role for embolization  Outpatient evaluation of his PAD as it has not been rechecked since evaluation several  years ago.  No immediate PAD issues need in hospital testing   Level 4   Electronic Signatures: Algernon Huxley (MD)  (Signed 21-Apr-14 10:06)  Authored: Chief Complaint and History, PAST MEDICAL/SURGICAL HISTORY, ALLERGIES, HOME MEDICATIONS, Family and Social History, Review of Systems, Physical Exam, LABS, RADIOLOGY, Assessment and Plan   Last Updated: 21-Apr-14 10:06 by Algernon Huxley (MD)
